# Patient Record
Sex: Male | Born: 1965 | Race: White | Hispanic: No | Smoking: Former smoker
Health system: Southern US, Community
[De-identification: ages and names within clinical notes are randomized; demographics above are authoritative.]

## PROBLEM LIST (undated history)

## (undated) DIAGNOSIS — G709 Myoneural disorder, unspecified: Secondary | ICD-10-CM

## (undated) DIAGNOSIS — G4733 Obstructive sleep apnea (adult) (pediatric): Secondary | ICD-10-CM

## (undated) DIAGNOSIS — F419 Anxiety disorder, unspecified: Secondary | ICD-10-CM

## (undated) DIAGNOSIS — R569 Unspecified convulsions: Secondary | ICD-10-CM

## (undated) HISTORY — DX: Anxiety disorder, unspecified: F41.9

## (undated) HISTORY — PX: APPENDECTOMY: SHX54

## (undated) HISTORY — DX: Obstructive sleep apnea (adult) (pediatric): G47.33

## (undated) HISTORY — PX: OTHER SURGICAL HISTORY: SHX169

## (undated) HISTORY — DX: Myoneural disorder, unspecified: G70.9

## (undated) HISTORY — DX: Unspecified convulsions: R56.9

---

## 2012-09-29 ENCOUNTER — Ambulatory Visit: Payer: BC Managed Care – PPO

## 2012-09-29 ENCOUNTER — Ambulatory Visit (INDEPENDENT_AMBULATORY_CARE_PROVIDER_SITE_OTHER): Payer: BC Managed Care – PPO | Admitting: Family Medicine

## 2012-09-29 DIAGNOSIS — M542 Cervicalgia: Secondary | ICD-10-CM

## 2012-09-29 DIAGNOSIS — S139XXA Sprain of joints and ligaments of unspecified parts of neck, initial encounter: Secondary | ICD-10-CM

## 2012-09-29 DIAGNOSIS — S161XXA Strain of muscle, fascia and tendon at neck level, initial encounter: Secondary | ICD-10-CM

## 2012-09-29 MED ORDER — DICLOFENAC SODIUM 75 MG PO TBEC: 75.0000 mg | DELAYED_RELEASE_TABLET | Freq: Two times a day (BID) | ORAL | Status: DC

## 2012-09-29 MED ORDER — CYCLOBENZAPRINE HCL 5 MG PO TABS: 5.0000 mg | ORAL_TABLET | Freq: Every evening | ORAL | Status: DC | PRN
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
## 2012-11-03 ENCOUNTER — Encounter: Payer: Self-pay | Admitting: Family Medicine

## 2012-11-03 ENCOUNTER — Ambulatory Visit (INDEPENDENT_AMBULATORY_CARE_PROVIDER_SITE_OTHER): Payer: BC Managed Care – PPO | Admitting: Family Medicine

## 2012-11-03 VITALS — BP 120/74 | HR 70 | Temp 98.5°F | Resp 16 | Ht 75.5 in | Wt 211.4 lb

## 2012-11-03 DIAGNOSIS — Z23 Encounter for immunization: Secondary | ICD-10-CM

## 2012-11-03 DIAGNOSIS — E785 Hyperlipidemia, unspecified: Secondary | ICD-10-CM

## 2012-11-03 DIAGNOSIS — G4700 Insomnia, unspecified: Secondary | ICD-10-CM

## 2012-11-03 DIAGNOSIS — M549 Dorsalgia, unspecified: Secondary | ICD-10-CM

## 2012-11-03 MED ORDER — ALPRAZOLAM ER 1 MG PO TB24: ORAL_TABLET | ORAL | Status: DC

## 2012-11-03 MED ORDER — PREDNISONE 20 MG PO TABS: ORAL_TABLET | ORAL | Status: DC

## 2012-11-03 NOTE — Progress Notes (Signed)
47 yo with sleep disorder with h/o OSA several years ago.  Occasionally noted to be snoring.  Not sleeping more than several hours a night:  Trouble with induction and early morning awakening.  Took xanax in past  The neck is better, but low back is hurting.  Being treated by chiropractor.  Worse with prolonged sitting.  Ibuprofen 800 mg.  Objective:  NAD, patient is tanned and appears in good health. We discussed insomnia with wife Idalia Needle.  HEENT: Unremarkable Chest: Clear Heart: Regular no murmur Extremities: No edema, good pulses, normal contour with no muscle atrophy Skin: No rash Neurological: Alert, appropriate, normal cranial nerves III through XII, normal gait  Assessment: Patient seems to be experiencing significant stress which is probably contributing to his insomnia. At the same time he was diagnosed with OSA several years ago and it seems reasonable to followup on this is a possibility for his insomnia.  Need for prophylactic vaccination and inoculation against influenza - Plan: Flu Vaccine QUAD 36+ mos IM  Back pain - Plan: predniSONE (DELTASONE) 20 MG tablet  Insomnia - Plan: ALPRAZolam (XANAX XR) 1 MG 24 hr tablet, Nocturnal polysomnography (NPSG)  Other and unspecified hyperlipidemia  Signed, Elvina Sidle, MD

## 2012-11-03 NOTE — Patient Instructions (Addendum)

## 2012-11-16 ENCOUNTER — Ambulatory Visit (INDEPENDENT_AMBULATORY_CARE_PROVIDER_SITE_OTHER): Payer: BC Managed Care – PPO | Admitting: Neurology

## 2012-11-16 ENCOUNTER — Encounter: Payer: Self-pay | Admitting: Neurology

## 2012-11-16 VITALS — BP 129/76 | HR 76 | Temp 98.0°F | Ht 76.0 in | Wt 214.0 lb

## 2012-11-16 DIAGNOSIS — Z8679 Personal history of other diseases of the circulatory system: Secondary | ICD-10-CM

## 2012-11-16 DIAGNOSIS — R51 Headache: Secondary | ICD-10-CM

## 2012-11-16 DIAGNOSIS — G4733 Obstructive sleep apnea (adult) (pediatric): Secondary | ICD-10-CM

## 2012-11-16 HISTORY — DX: Obstructive sleep apnea (adult) (pediatric): G47.33
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
## 2012-11-16 NOTE — Patient Instructions (Signed)
Based on your symptoms and your exam I believe you are at risk for obstructive sleep apnea or OSA, and I think we should proceed with a sleep study to determine whether you do or do not have OSA and how severe it is. If you have more than mild OSA, I want you to consider treatment with CPAP. Please remember, the risks and ramifications of moderate to severe obstructive sleep apnea or OSA are: Cardiovascular disease, including congestive heart failure, stroke, difficult to control hypertension, arrhythmias, and even type 2 diabetes has been linked to untreated OSA. Sleep apnea causes disruption of sleep and sleep deprivation in most cases, which, in turn, can cause recurrent headaches, problems with memory, mood, concentration, focus, and vigilance. Most people with untreated sleep apnea report excessive daytime sleepiness, which can affect their ability to drive. Please do not drive if you feel sleepy.  I will see you back after your sleep study to go over the test results and where to go from there. We will call you after your sleep study and to set up an appointment at the time.  Please remember to try to maintain good sleep hygiene, which means: Keep a regular sleep and wake schedule, try not to exercise or have a meal within 2 hours of your bedtime, try to keep your bedroom conducive for sleep, that is, cool and dark, without light distractors such as an illuminated alarm clock, and refrain from watching TV right before sleep or in the middle of the night and do not keep the TV or radio on during the night. Also, try not to use or play on electronic devices at bedtime, such as your cell phone, tablet PC or laptop. If you like to read at bedtime on an electronic device, try to dim the background light as much as possible.  We will do a brain MRI and MRA to investigate your headaches.  Please remember, common headache triggers are: sleep deprivation, dehydration, overheating, stress, hypoglycemia or skipping  meals and blood sugar fluctuations, excessive pain medications or excessive alcohol use or caffeine withdrawal. Some people have food triggers such as aged cheese, orange juice or chocolate, especially dark chocolate, or MSG (monosodium glutamate). Try to avoid these headache triggers as much possible. It may be helpful to keep a headache diary to figure out what makes your headaches worse or brings them on and what alleviates them. Some people report headache onset after exercise but studies have shown that regular exercise may actually prevent headaches from coming. If you have exercise-induced headaches, please make sure that you drink plenty of fluid before and after exercising and that you do not over do it and do not overheat.

## 2012-11-22 ENCOUNTER — Ambulatory Visit (INDEPENDENT_AMBULATORY_CARE_PROVIDER_SITE_OTHER): Payer: BC Managed Care – PPO | Admitting: Neurology

## 2012-11-22 DIAGNOSIS — G4710 Hypersomnia, unspecified: Secondary | ICD-10-CM

## 2012-11-22 DIAGNOSIS — G4733 Obstructive sleep apnea (adult) (pediatric): Secondary | ICD-10-CM

## 2012-11-22 DIAGNOSIS — Z8679 Personal history of other diseases of the circulatory system: Secondary | ICD-10-CM

## 2012-11-25 ENCOUNTER — Telehealth: Payer: Self-pay

## 2012-11-25 NOTE — Telephone Encounter (Signed)
Please put a copy of the results in my box.

## 2012-11-25 NOTE — Telephone Encounter (Signed)
Pt is calling about his [employer] report. We sent him a copy and he is very worried because a lot of his numbers are above normal range. Dr. Milus Glazier, can you please call pt and explain it to him. I don't understand enough about the [employer] profiles to answer his questions.

## 2012-11-25 NOTE — Telephone Encounter (Signed)
PTS GIRLFRIEND MEREDITH JOHNSON IS CALLING REGARDING A LETTER THAT PT WAS SENT FROM DR.LAUENSTEIN SHE STATES THAT THE LETTER IS REGARDING PTS LABS. SHE ALSO STATES THAT HE IS CONFUSED ABOUT THEM. BEST# [phone]

## 2012-11-27 ENCOUNTER — Telehealth: Payer: Self-pay

## 2012-11-27 NOTE — Telephone Encounter (Signed)
I do not have a copy of the liposcience report.  I cannot comment on something I don't have or know about, certainly after hours on a Sunday.

## 2012-11-27 NOTE — Telephone Encounter (Signed)
Pt's girlfriend called to make sure that Dr.L is going to give the pt a call today.  Call at [phone].

## 2012-11-28 ENCOUNTER — Encounter: Payer: Self-pay | Admitting: Family Medicine

## 2012-11-28 ENCOUNTER — Telehealth: Payer: Self-pay
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
## 2012-11-28 NOTE — Telephone Encounter (Signed)
Advised pt that I put another copy of his liposcience results into your box and that we should be calling him.  It looks like he got a copy of his liposcience results.

## 2012-11-28 NOTE — Telephone Encounter (Signed)
Copy is now in your box in Dr lounge.

## 2012-11-30 ENCOUNTER — Telehealth: Payer: Self-pay | Admitting: Radiology

## 2012-11-30 NOTE — Telephone Encounter (Signed)
Phone call to patient regarding the cholesterol levels. Advised of contents of letter and the labs have been mailed to him.

## 2012-12-01 ENCOUNTER — Other Ambulatory Visit: Payer: BC Managed Care – PPO

## 2012-12-02 ENCOUNTER — Telehealth: Payer: Self-pay | Admitting: Neurology

## 2012-12-02 DIAGNOSIS — G4733 Obstructive sleep apnea (adult) (pediatric): Secondary | ICD-10-CM

## 2012-12-02 NOTE — Telephone Encounter (Signed)
Please call and notify patient that the recent sleep study confirmed the diagnosis of OSA. He did very well with CPAP during the study with significant improvement of the respiratory events. Therefore, I would like start the patient on CPAP at home. I placed the order in the chart.   Arrange for CPAP set up at home through a DME company of patient's choice and fax/route report to PCP and referring MD (if other than PCP).   The patient will also need a follow up appointment with me in 6-8 weeks post set up that has to be scheduled; help the patient schedule this (in a follow-up slot).   Please re-enforce the importance of compliance with treatment and the need for us to monitor compliance data.   Once you have spoken to the patient and scheduled the return appointment, you may close this encounter, thanks,   Dalexa Gentz, MD, PhD [facility]    

## 2012-12-06 ENCOUNTER — Telehealth: Payer: Self-pay

## 2012-12-06 NOTE — Telephone Encounter (Signed)
I called her last week about these. Called again, letter was sent [employer] Report on your cholesterol has come back and I have enclosed it for you.  As you can see, your total cholesterol is very acceptable 199. The high density cholesterol is also excellent at 52. The low-density cholesterol, which is a risk factor, is elevated at 140. Don't think you need to change any of your medicines at this point. The report goes on to list several other kinds of risk factors such as insulin level and glucose which are normal.  Left message for call back.

## 2012-12-06 NOTE — Telephone Encounter (Signed)
Allen Jenkins wants a call back to discuss lab results.   [phone]

## 2012-12-07 ENCOUNTER — Encounter: Payer: Self-pay | Admitting: *Deleted

## 2012-12-07 NOTE — Telephone Encounter (Signed)
Called pt and LM, he then called me back and we discussed results and new order for CPAP.  Pt sounds excited to get started on therapy because he is really having some trouble getting through the things he needs to do each day.  He previously had CPAP therapy but wasn't able to use it and had turned the machine in.  He currently lives in [place] but also has a permanent home in [place].    We will send his order to [facility] due to their [facility] location.  Orders will be marked ASAP.  Patient states he has an address to send things to in [facility], it is [address], [place], [place] [address].  I will enter this in as a temporary address.  A copy of the sleep study interpretive report as well as a letter with info regarding contact info for the DME company, the importance of CPAP compliance and the need to schedule a 6-8 week follow up appointment once he receives CPAP will be mailed to the patient's home.

## 2013-01-17 ENCOUNTER — Encounter: Payer: Self-pay | Admitting: Neurology

## 2013-01-22 NOTE — Progress Notes (Signed)
Quick Note:  I reviewed the patient's CPAP compliance data from 12/15/2012 to 01/13/2013, which is a total of 30 days, during which time the patient used CPAP every day except for 7 days. The average usage for all days was 3 hours and 34 minutes. The percent used days greater than 4 hours was only 50%, indicating fair compliance. The residual AHI was 2.4 per hour, indicating an appropriate treatment pressure of 7 cwp with EPR of 2. I will review this data with the patient at the next office visit, provide feedback and additional troubleshooting if need be. It looks like his initial compliance was suboptimal but has picked up more recently. The patient has an upcoming appointment soon and I will discuss these findings with him soon.  Huston Foley, MD, PhD [facility] ([facility])   ______

## 2013-01-26 ENCOUNTER — Ambulatory Visit: Payer: BC Managed Care – PPO | Admitting: Neurology

## 2013-02-08 ENCOUNTER — Encounter (INDEPENDENT_AMBULATORY_CARE_PROVIDER_SITE_OTHER): Payer: Self-pay

## 2013-02-08 ENCOUNTER — Encounter: Payer: Self-pay | Admitting: Neurology

## 2013-02-08 ENCOUNTER — Ambulatory Visit (INDEPENDENT_AMBULATORY_CARE_PROVIDER_SITE_OTHER): Payer: BC Managed Care – PPO | Admitting: Neurology

## 2013-02-08 VITALS — BP 127/86 | HR 80 | Temp 98.4°F | Ht 76.0 in

## 2013-02-08 DIAGNOSIS — G4700 Insomnia, unspecified: Secondary | ICD-10-CM

## 2013-02-08 DIAGNOSIS — G4733 Obstructive sleep apnea (adult) (pediatric): Secondary | ICD-10-CM

## 2013-02-08 MED ORDER — ZALEPLON 5 MG PO CAPS: 5.0000 mg | ORAL_CAPSULE | Freq: Every evening | ORAL | Status: DC | PRN
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
## 2013-02-08 NOTE — Patient Instructions (Signed)
Please continue using your CPAP regularly. While your insurance requires that you use CPAP at least 4 hours each night on 70% of the nights, I recommend, that you not skip any nights and use it throughout the night if you can. Getting used to CPAP does take time and patience and discipline. Untreated obstructive sleep apnea when it is moderate to severe can have an adverse impact on cardiovascular health and raise her risk for heart disease, arrhythmias, hypertension, congestive heart failure, stroke and diabetes. Untreated obstructive sleep apnea causes sleep disruption, nonrestorative sleep, and sleep deprivation. This can have an impact on your day to day functioning and cause daytime sleepiness and impairment of cognitive function, memory loss, mood disturbance, and problems focussing. Using CPAP regularly can improve these symptoms.  Please remember to try to maintain good sleep hygiene, which means: Keep a regular sleep and wake schedule, try not to exercise or have a meal within 2 hours of your bedtime, try to keep your bedroom conducive for sleep, that is, cool and dark, without light distractors such as an illuminated alarm clock, and refrain from watching TV right before sleep or in the middle of the night and do not keep the TV or radio on during the night. Also, try not to use or play on electronic devices at bedtime, such as your cell phone, tablet PC or laptop. If you like to read at bedtime on an electronic device, try to dim the background light as much as possible. Do not eat in the middle of the night.   We will try a small dose of Sonata (zaleplon) for your sleep maintenance issues, take no later than 2 AM to avoid daytime sedation.   Talk to your PCP about starting an antidepressant for your increased mood issues and anxiety.

## 2013-02-09 ENCOUNTER — Encounter: Payer: Self-pay | Admitting: Neurology

## 2013-02-13 ENCOUNTER — Encounter: Payer: Self-pay | Admitting: Neurology

## 2013-04-06 DIAGNOSIS — Z0271 Encounter for disability determination: Secondary | ICD-10-CM

## 2013-04-24 ENCOUNTER — Telehealth: Payer: Self-pay

## 2013-04-25 ENCOUNTER — Ambulatory Visit (INDEPENDENT_AMBULATORY_CARE_PROVIDER_SITE_OTHER): Payer: BC Managed Care – PPO | Admitting: Family Medicine

## 2013-04-25 VITALS — BP 120/78 | HR 72 | Temp 97.8°F | Resp 18 | Ht 75.5 in | Wt 221.0 lb

## 2013-04-25 DIAGNOSIS — Z8679 Personal history of other diseases of the circulatory system: Secondary | ICD-10-CM

## 2013-04-25 NOTE — Progress Notes (Signed)
HPI HPI Comments: Allen Jenkins is a 48 y.o. male who arrives to the [facility] for a medical evaluation to apply for a [employer] health insurance policy. Pt had an brain aneurism October 2011 and his denies having any residual effects due to prior condition. Pt denies seizure. He states that he is maintaining his glucose levels and is taking meaningful steps to better his health.   Pt currently runs a business. He is currently looking to join a gym and likes to golf.    Objective: Patient in no acute distress and relates normally HEENT: Unremarkable Chest: Clear Heart: Regular no murmur Neurological: No mental status, normal cranial nerves, normal reflexes, negative Romberg, normal gait Skin: No rashes or suspicious lesions  Assessment: Patient has been totally symptom-free 4 years following a brief episode treated to his brain aneurysm. Anticipate no further problems.  Plan: I for her insurance company purpose.  Signed, Sheila OatsKurt Lauenstein M.D.

## 2013-05-11 ENCOUNTER — Telehealth: Payer: Self-pay | Admitting: Neurology

## 2013-05-11 NOTE — Telephone Encounter (Signed)
I called Allen Jenkins to remind him of his apt w/Dr. Frances FurbishAthar on 05/15/13. Allen Jenkins is not for sure if he can make it ask me to c/b 05/12/13 to confirm. Allen Jenkins is also up set he is having to pay for his CPAP when last year he paid nothing. Allen Jenkins states [employer] is making this his deductible $105.00 a month and Allen Jenkins doesn't feel like he should have to pay when it is a necessity to have. Allen Jenkins states he was advised that he had severe sleep apnea and that this should be considered something that he has to have to keep him alive. Allen Jenkins would like to see if we could call [facility] and see what they can do or with there is anything we can do to fix this where Allen Jenkins doesn't have to pay for this. Allen Jenkins states he was advised last year that he would not have to pay for this item.

## 2013-05-15 ENCOUNTER — Encounter: Payer: Self-pay | Admitting: Neurology

## 2013-05-15 ENCOUNTER — Ambulatory Visit (INDEPENDENT_AMBULATORY_CARE_PROVIDER_SITE_OTHER): Payer: BC Managed Care – PPO | Admitting: Neurology

## 2013-05-15 ENCOUNTER — Ambulatory Visit: Payer: BC Managed Care – PPO | Admitting: Neurology

## 2013-05-15 VITALS — BP 115/77 | HR 76 | Temp 97.9°F | Ht 75.5 in | Wt 221.0 lb

## 2013-05-15 DIAGNOSIS — G4733 Obstructive sleep apnea (adult) (pediatric): Secondary | ICD-10-CM

## 2013-05-15 DIAGNOSIS — G4730 Sleep apnea, unspecified: Secondary | ICD-10-CM

## 2013-05-15 DIAGNOSIS — G4700 Insomnia, unspecified: Secondary | ICD-10-CM

## 2013-05-15 DIAGNOSIS — Z8679 Personal history of other diseases of the circulatory system: Secondary | ICD-10-CM

## 2013-05-15 MED ORDER — SUVOREXANT 10 MG PO TABS: 10.0000 mg | ORAL_TABLET | Freq: Every evening | ORAL | Status: DC | PRN
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
## 2013-05-15 NOTE — Patient Instructions (Addendum)
Please continue using your CPAP regularly. While your insurance requires that you use CPAP at least 4 hours each night on 70% of the nights, I recommend, that you not skip any nights and use it throughout the night if you can. Getting used to CPAP does take time and patience and discipline. Untreated obstructive sleep apnea when it is moderate to severe can have an adverse impact on cardiovascular health and raise her risk for heart disease, arrhythmias, hypertension, congestive heart failure, stroke and diabetes. Untreated obstructive sleep apnea causes sleep disruption, nonrestorative sleep, and sleep deprivation. This can have an impact on your day to day functioning and cause daytime sleepiness and impairment of cognitive function, memory loss, mood disturbance, and problems focussing. Using CPAP regularly can improve these symptoms.  We will try a new sleep aid, called Belsomra for 10 days. Call back in 1 week to report, how this is working for you. I can give you a prescription for this medication, as long as you use your CPAP while taking this.

## 2013-05-18 ENCOUNTER — Encounter: Payer: Self-pay | Admitting: Neurology

## 2013-06-05 NOTE — Telephone Encounter (Signed)
No msg °

## 2013-07-03 ENCOUNTER — Other Ambulatory Visit: Payer: Self-pay | Admitting: Family Medicine

## 2013-07-04 NOTE — Telephone Encounter (Signed)
Faxed

## 2013-10-12 ENCOUNTER — Ambulatory Visit: Payer: BC Managed Care – PPO | Admitting: Neurology

## 2013-10-12 ENCOUNTER — Telehealth: Payer: Self-pay | Admitting: Neurology

## 2013-10-12 ENCOUNTER — Encounter: Payer: Self-pay | Admitting: Neurology

## 2013-10-12 NOTE — Telephone Encounter (Signed)
Patient no showed for an appointment today, 10/12/2013, at 1530.

## 2013-12-07 ENCOUNTER — Ambulatory Visit (INDEPENDENT_AMBULATORY_CARE_PROVIDER_SITE_OTHER): Payer: BC Managed Care – PPO | Admitting: Family Medicine

## 2013-12-07 VITALS — BP 119/80 | HR 71 | Temp 98.2°F | Resp 16 | Ht 75.5 in | Wt 217.0 lb

## 2013-12-07 DIAGNOSIS — S161XXA Strain of muscle, fascia and tendon at neck level, initial encounter: Secondary | ICD-10-CM

## 2013-12-07 DIAGNOSIS — J209 Acute bronchitis, unspecified: Secondary | ICD-10-CM

## 2013-12-07 DIAGNOSIS — G4700 Insomnia, unspecified: Secondary | ICD-10-CM

## 2013-12-07 MED ORDER — TRAZODONE HCL 50 MG PO TABS: 25.0000 mg | ORAL_TABLET | Freq: Every evening | ORAL | Status: AC | PRN

## 2013-12-07 MED ORDER — CYCLOBENZAPRINE HCL 5 MG PO TABS: 5.0000 mg | ORAL_TABLET | Freq: Every evening | ORAL | Status: AC | PRN

## 2013-12-07 MED ORDER — AZITHROMYCIN 250 MG PO TABS: ORAL_TABLET | ORAL | Status: DC

## 2013-12-07 NOTE — Progress Notes (Signed)
Patient ID: Allen Jenkins, male   DOB: 11/04/1965, 48 y.o.   MRN: [id number]  This chart was scribed for Elvina SidleKurt Tytan Sandate, MD by Charline BillsEssence Howell, ED Scribe. The patient was seen in room 5. Patient's care was started at 9:35 AM.  Patient ID: Allen SicilianDavid Botkin MRN: [id number], DOB: 11/07/1965, 48 y.o. Date of Encounter: 12/07/2013, 9:34 AM  Primary Physician: Elvina SidleLAUENSTEIN,Jessly Lebeck, MD  Chief Complaint  Patient presents with  . Immunizations    flu shot  . Chest Congestion    x2-3 days  . Paperwork    Pt. states he dropped off paperwork last night by signed for his medication     HPI: 48 y.o. year old male with history below presents with chest tightness onset 2-3 days ago. He reports associated cough, worse at night and early morning. Pt suspects that this is the start of an URI. He reports similar symptoms 2 months ago and was seen by an urgent care center for treatment. Pt denies tobacco use but reports vapor use.  Insomnia  Pt presents with difficulty sleeping. Pt states that he recently ran out of Xanax. Pt has tried Ambien in the past but states that it did not work well for him. He suspects that insomnia is effecting his testosterone.   Back pain Pt also reports intermittent back pain and shoulder pain. He sees a Landchiropractor x2 weekly. Pt requests a refill of Flexeril during this visit. Pt states that he drives a minimum of 800 miles per week which exacerbates his symptoms.   Pt states that he dropped off paperwork yesterday that he needs signed for his medication.   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 He states that he recently bought the business and states that business is fine. He states that he finalized his divorced and recently got a house. Pt's daughter is 117 y.o who he picks up from [place], [place] every other weekend.   Past Medical History  Diagnosis Date  . Anxiety   . Seizures   . Neuromuscular disorder   . OSA (obstructive sleep apnea) 11/16/2012     Home  Meds: Prior to Admission medications   Medication Sig Start Date End Date Taking? Authorizing Provider  ALPRAZolam (XANAX XR) 1 MG 24 hr tablet TAKE 1 TABLET BY MOUTH EVERY NIGHT AT BEDTIME AS NEEDED   Yes Elvina SidleKurt Honora Searson, MD  cyclobenzaprine (FLEXERIL) 5 MG tablet Take 1 tablet (5 mg total) by mouth at bedtime as needed and may repeat dose one time if needed for muscle spasms. 09/29/12   Elvina SidleKurt Dannis Deroche, MD  Suvorexant (BELSOMRA) 10 MG TABS Take 10 mg by mouth at bedtime as needed. 05/15/13   Huston FoleySaima Athar, MD  zaleplon (SONATA) 5 MG capsule Take 1 capsule (5 mg total) by mouth at bedtime as needed for sleep. Take no later than 2 AM. 02/08/13   Huston FoleySaima Athar, MD    Allergies:  Allergies  Allergen Reactions  . Ambien [Zolpidem Tartrate]     Insomnia     History   Social History  . Marital Status: Divorced    Spouse Name: N/A    Number of Children: N/A  . Years of Education: N/A   Occupational History  . Not on file.   Social History Main Topics  . Smoking status: Former Games developermoker  . Smokeless tobacco: Not on file  . Alcohol Use: No  . Drug Use: No  . Sexual Activity: Yes   Other Topics Concern  . Not on  file   Social History Narrative  . No narrative on file     Review of Systems: Constitutional: negative for chills, fever, night sweats, weight changes, or fatigue  HEENT: negative for vision changes, hearing loss, congestion, rhinorrhea, ST, epistaxis, or sinus pressure Cardiovascular: negative for chest pain or palpitations Respiratory: negative for hemoptysis, wheezing, or shortness of breath, +chest tightness, +cough Abdominal: negative for abdominal pain, nausea, vomiting, diarrhea, or constipation Msk: +back pain, +arthralgias  Dermatological: negative for rash Neurologic: negative for headache, dizziness, or syncope, +sleep disturbances  All other systems reviewed and are otherwise negative with the exception to those above and in the HPI.   Physical Exam: Triage  Vitals: Blood pressure 119/80, pulse 71, temperature 98.2 F (36.8 C), temperature source Oral, resp. rate 16, height 6' 3.5" (1.918 m), weight 217 lb (98.431 kg), SpO2 95.00%., Body mass index is 26.76 kg/(m^2). General: Well developed, well nourished, in no acute distress. Head: Normocephalic, atraumatic, eyes without discharge, sclera non-icteric, nares are without discharge. Bilateral auditory canals clear, TM's are without perforation, pearly grey and translucent with reflective cone of light bilaterally. Oral cavity moist, posterior pharynx without exudate, erythema, peritonsillar abscess, or post nasal drip.  Neck: Supple. No thyromegaly. Full ROM. No lymphadenopathy. Lungs: Clear bilaterally to auscultation without wheezes, rales, or rhonchi. Breathing is unlabored. Heart: RRR with S1 S2. No murmurs, rubs, or gallops appreciated. Abdomen: Soft, non-tender, non-distended with normoactive bowel sounds. No hepatomegaly. No rebound/guarding. No obvious abdominal masses. Msk:  Strength and tone normal for age. Extremities/Skin: Warm and dry. No clubbing or cyanosis. No edema. No rashes or suspicious lesions. Neuro: Alert and oriented X 3. Moves all extremities spontaneously. Gait is normal. CNII-XII grossly in tact. Psych:  Responds to questions appropriately with a normal affect.   Labs:  ASSESSMENT AND PLAN:  48 y.o. year old male with  The primary encounter diagnosis was Insomnia. Diagnoses of Neck strain, initial encounter and Acute bronchitis, unspecified organism were also pertinent to this visit.  I personally performed the services described in this documentation, which was scribed in my presence. The recorded information has been reviewed and is accurate. Insomnia - Plan: traZODone (DESYREL) 50 MG tablet  Neck strain, initial encounter - Plan: cyclobenzaprine (FLEXERIL) 5 MG tablet  Acute bronchitis, unspecified organism - Plan: azithromycin (ZITHROMAX Z-PAK) 250 MG  tablet   Signed, Elvina SidleKurt Augustine Leverette, MD 12/07/2013 9:34 AM

## 2014-01-24 ENCOUNTER — Other Ambulatory Visit: Payer: Self-pay | Admitting: Family Medicine

## 2014-01-24 ENCOUNTER — Telehealth: Payer: Self-pay

## 2014-01-24 MED ORDER — ALPRAZOLAM 0.5 MG PO TABS: 0.5000 mg | ORAL_TABLET | Freq: Every evening | ORAL | Status: AC | PRN

## 2014-01-24 NOTE — Telephone Encounter (Signed)
Patient called to ask if Dr. Milus GlazierLauenstein can call in a rx for xanax for patient. He is going on a cruise tomorrow at 4:30pm and it is leaving [place] go [place]. He needs this done today please and [facility] [address]   Best: [phone]  [facility] [place], [place] - [address]

## 2014-01-26 NOTE — Telephone Encounter (Signed)
Faxed Rx and notified pt on VM that was done. Apologized that it was not in time for his trip.

## 2014-03-20 ENCOUNTER — Other Ambulatory Visit: Payer: Self-pay | Admitting: Family Medicine

## 2014-03-20 DIAGNOSIS — E785 Hyperlipidemia, unspecified: Secondary | ICD-10-CM

## 2014-05-01 ENCOUNTER — Ambulatory Visit (INDEPENDENT_AMBULATORY_CARE_PROVIDER_SITE_OTHER): Payer: 59 | Admitting: Family Medicine

## 2014-05-01 ENCOUNTER — Ambulatory Visit (INDEPENDENT_AMBULATORY_CARE_PROVIDER_SITE_OTHER): Payer: 59

## 2014-05-01 VITALS — BP 130/82 | HR 59 | Temp 97.6°F | Resp 16 | Ht 75.25 in | Wt 216.0 lb

## 2014-05-01 DIAGNOSIS — G4733 Obstructive sleep apnea (adult) (pediatric): Secondary | ICD-10-CM

## 2014-05-01 DIAGNOSIS — M79645 Pain in left finger(s): Secondary | ICD-10-CM

## 2014-05-01 DIAGNOSIS — M779 Enthesopathy, unspecified: Secondary | ICD-10-CM | POA: Diagnosis not present

## 2014-05-01 DIAGNOSIS — M778 Other enthesopathies, not elsewhere classified: Secondary | ICD-10-CM

## 2014-05-01 DIAGNOSIS — G4700 Insomnia, unspecified: Secondary | ICD-10-CM

## 2014-05-01 MED ORDER — DICLOFENAC SODIUM 75 MG PO TBEC: 75.0000 mg | DELAYED_RELEASE_TABLET | Freq: Two times a day (BID) | ORAL | Status: AC

## 2014-05-01 MED ORDER — TRIAMCINOLONE ACETONIDE 40 MG/ML IJ SUSP
20.0000 mg | Freq: Once | INTRAMUSCULAR | Status: AC
  Administered 2014-05-01: 20 mg via INTRA_ARTICULAR
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
## 2014-05-01 NOTE — Patient Instructions (Signed)
Take the trazodone one half of 50 mg at bedtime for about a week, then increase to one if tolerated. If doing well after another 2 weeks can increase on to 75 mg.  Take the diclofenac one twice daily for the finger.  If finger not improved over the next 10-14 days contact me for referral to orthopedic hand specialist  If sleep disturbance not improving please return  Insomnia Insomnia is frequent trouble falling and/or staying asleep. Insomnia can be a long term problem or a short term problem. Both are common. Insomnia can be a short term problem when the wakefulness is related to a certain stress or worry. Long term insomnia is often related to ongoing stress during waking hours and/or poor sleeping habits. Overtime, sleep deprivation itself can make the problem worse. Every little thing feels more severe because you are overtired and your ability to cope is decreased. CAUSES   Stress, anxiety, and depression.  Poor sleeping habits.  Distractions such as TV in the bedroom.  Naps close to bedtime.  Engaging in emotionally charged conversations before bed.  Technical reading before sleep.  Alcohol and other sedatives. They may make the problem worse. They can hurt normal sleep patterns and normal dream activity.  Stimulants such as caffeine for several hours prior to bedtime.  Pain syndromes and shortness of breath can cause insomnia.  Exercise late at night.  Changing time zones may cause sleeping problems (jet lag). It is sometimes helpful to have someone observe your sleeping patterns. They should look for periods of not breathing during the night (sleep apnea). They should also look to see how long those periods last. If you live alone or observers are uncertain, you can also be observed at a sleep clinic where your sleep patterns will be professionally monitored. Sleep apnea requires a checkup and treatment. Give your caregivers your medical history. Give your caregivers  observations your family has made about your sleep.  SYMPTOMS   Not feeling rested in the morning.  Anxiety and restlessness at bedtime.  Difficulty falling and staying asleep. TREATMENT   Your caregiver may prescribe treatment for an underlying medical disorders. Your caregiver can give advice or help if you are using alcohol or other drugs for self-medication. Treatment of underlying problems will usually eliminate insomnia problems.  Medications can be prescribed for short time use. They are generally not recommended for lengthy use.  Over-the-counter sleep medicines are not recommended for lengthy use. They can be habit forming.  You can promote easier sleeping by making lifestyle changes such as:  Using relaxation techniques that help with breathing and reduce muscle tension.  Exercising earlier in the day.  Changing your diet and the time of your last meal. No night time snacks.  Establish a regular time to go to bed.  Counseling can help with stressful problems and worry.  Soothing music and white noise may be helpful if there are background noises you cannot remove.  Stop tedious detailed work at least one hour before bedtime. HOME CARE INSTRUCTIONS   Keep a diary. Inform your caregiver about your progress. This includes any medication side effects. See your caregiver regularly. Take note of:  Times when you are asleep.  Times when you are awake during the night.  The quality of your sleep.  How you feel the next day. This information will help your caregiver care for you.  Get out of bed if you are still awake after 15 minutes. Read or do some quiet activity.  Keep the lights down. Wait until you feel sleepy and go back to bed.  Keep regular sleeping and waking hours. Avoid naps.  Exercise regularly.  Avoid distractions at bedtime. Distractions include watching television or engaging in any intense or detailed activity like attempting to balance the household  checkbook.  Develop a bedtime ritual. Keep a familiar routine of bathing, brushing your teeth, climbing into bed at the same time each night, listening to soothing music. Routines increase the success of falling to sleep faster.  Use relaxation techniques. This can be using breathing and muscle tension release routines. It can also include visualizing peaceful scenes. You can also help control troubling or intruding thoughts by keeping your mind occupied with boring or repetitive thoughts like the old concept of counting sheep. You can make it more creative like imagining planting one beautiful flower after another in your backyard garden.  During your day, work to eliminate stress. When this is not possible use some of the previous suggestions to help reduce the anxiety that accompanies stressful situations. MAKE SURE YOU:   Understand these instructions.  Will watch your condition.  Will get help right away if you are not doing well or get worse. Document Released: 02/07/2000 Document Revised: 05/04/2011 Document Reviewed: 03/09/2007 [facility] Patient Information 2015 [place], [place]. This information is not intended to replace advice given to you by your health care provider. Make sure you discuss any questions you have with your health care provider.

## 2014-05-14 ENCOUNTER — Ambulatory Visit: Payer: Self-pay | Admitting: Internal Medicine

## 2016-11-11 IMAGING — CR DG FINGER LITTLE 2+V*L*
1 series · 1 of 1 positions shown · non-contrast
Comparison: None.

CLINICAL DATA: Pain in the small finger of the left hand.

EXAM:
LEFT LITTLE FINGER 2+V

[PA]
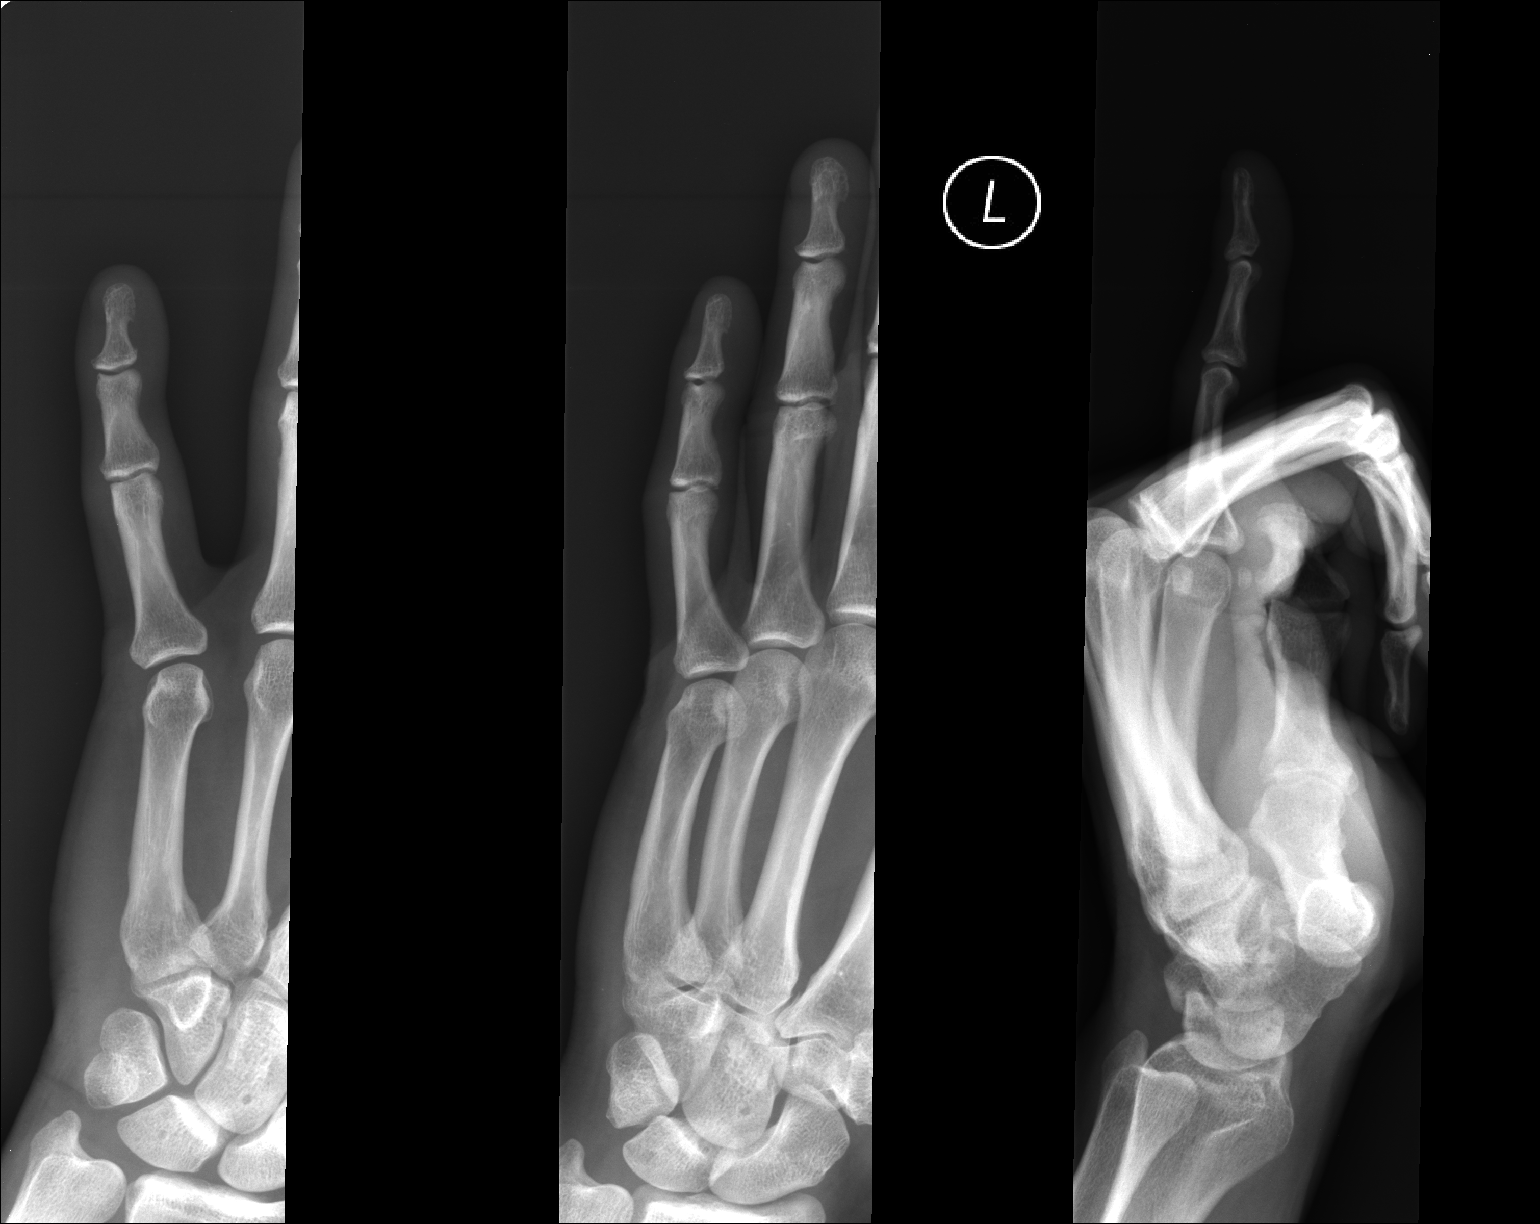

[1 of 1 positions shown; findings below may reference images not displayed]

FINDINGS: There is no evidence of fracture or dislocation. There is no
evidence of arthropathy or other focal bone abnormality. Soft
tissues are unremarkable.
IMPRESSION: Negative.
